# Patient Record
Sex: Male | Born: 1990 | Race: White | Hispanic: No | Marital: Single | Smoking: Former smoker
Health system: Southern US, Community
[De-identification: ages and names within clinical notes are randomized; demographics above are authoritative.]

## PROBLEM LIST (undated history)

## (undated) DIAGNOSIS — F329 Major depressive disorder, single episode, unspecified: Secondary | ICD-10-CM

## (undated) DIAGNOSIS — F32A Depression, unspecified: Secondary | ICD-10-CM

## (undated) DIAGNOSIS — F419 Anxiety disorder, unspecified: Secondary | ICD-10-CM

## (undated) DIAGNOSIS — J45909 Unspecified asthma, uncomplicated: Secondary | ICD-10-CM

## (undated) HISTORY — PX: WISDOM TOOTH EXTRACTION: SHX21

## (undated) HISTORY — DX: Anxiety disorder, unspecified: F41.9

## (undated) HISTORY — DX: Unspecified asthma, uncomplicated: J45.909

---

## 2008-01-09 ENCOUNTER — Emergency Department (HOSPITAL_COMMUNITY): Admission: EM | Admit: 2008-01-09 | Discharge: 2008-01-09 | Payer: Self-pay | Admitting: Emergency Medicine

## 2008-05-17 ENCOUNTER — Emergency Department: Payer: Self-pay | Admitting: Emergency Medicine

## 2008-10-22 IMAGING — CT CT HEAD W/O CM
1 series · 16 of 30 positions shown, 20 images · IV contrast (agent unspecified)
Comparison: none

CLINICAL DATA: Dizziness, nausea, difficulty walking.  
 HEAD CT WITHOUT CONTRAST:
TECHNIQUE: Contiguous axial images were obtained from the base of the skull through the vertex according to standard protocol without contrast.

[Series 2: head_seq 4.5 h37s st · axial · 0.43mm/px · z∈[+1185,+1329]mm · 16 of 36 slices shown, 20 images]
[im 2/36  brain]
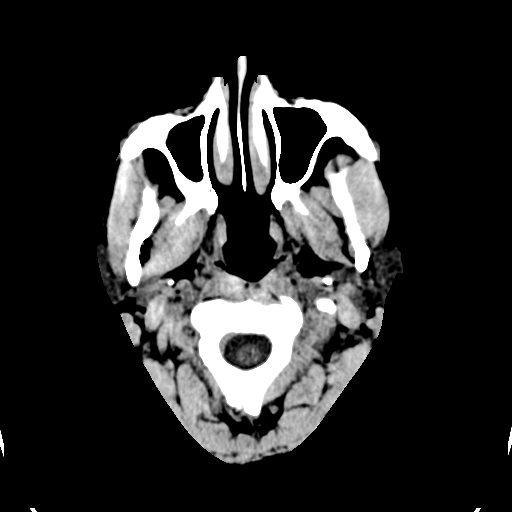
[im 2/36  bone]
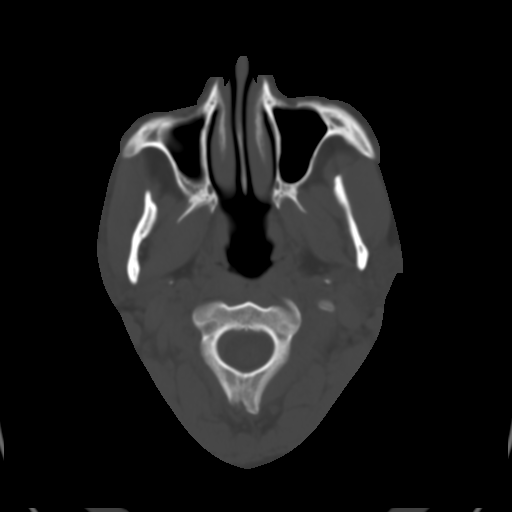
[im 4/36  brain]
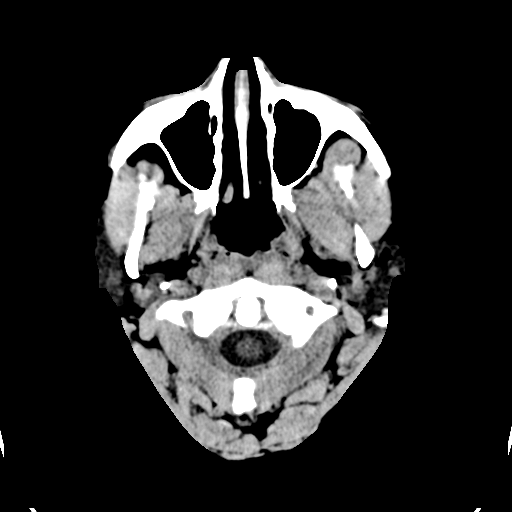
[im 7/36  brain]
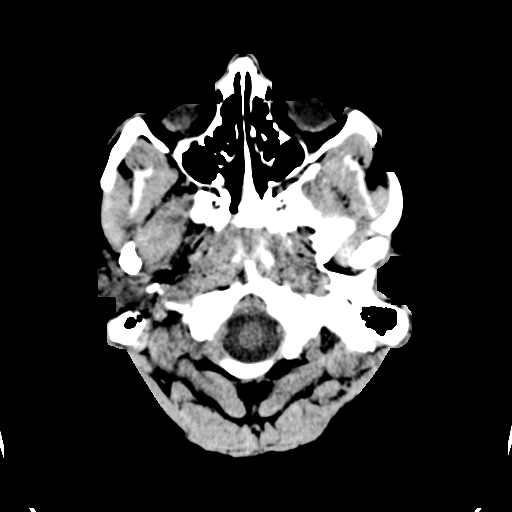
[im 9/36  brain]
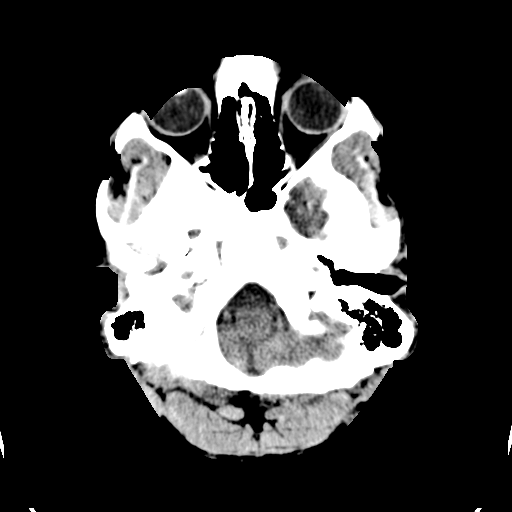
[im 10/36  brain]
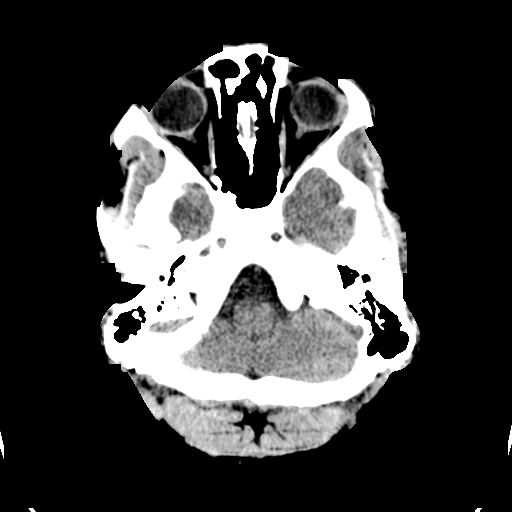
[im 10/36  bone]
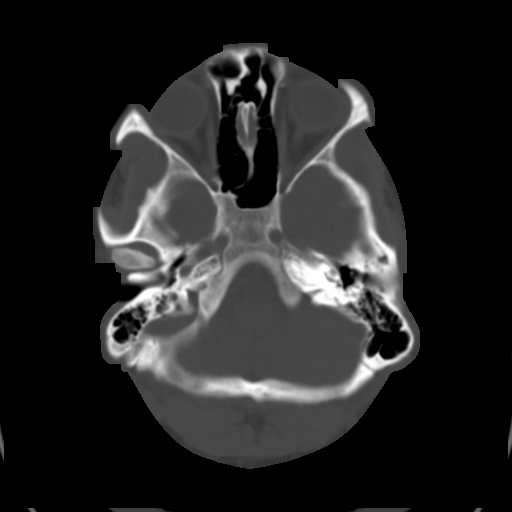
[im 13/36  brain]
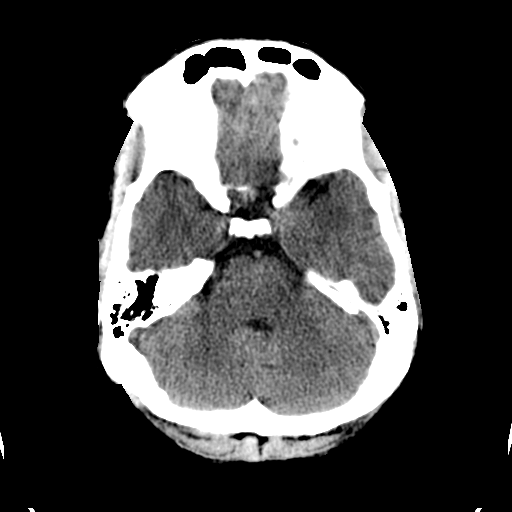
[im 15/36  brain]
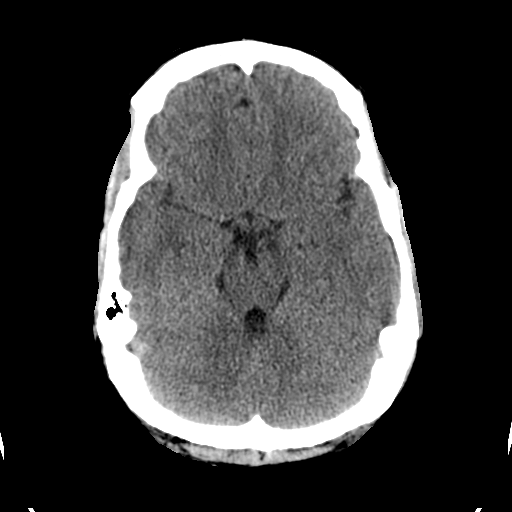
[im 17/36  brain]
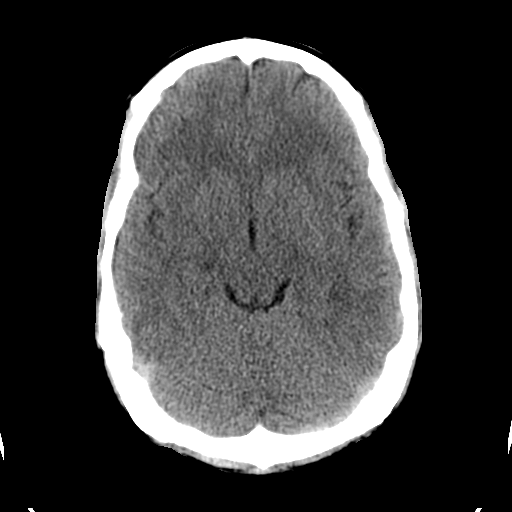
[im 19/36  brain]
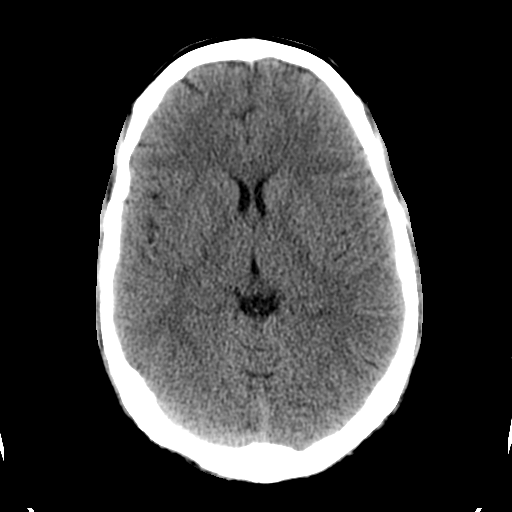
[im 19/36  bone]
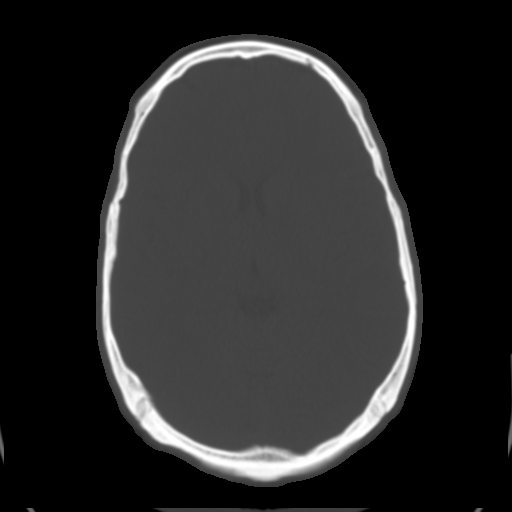
[im 21/36  brain]
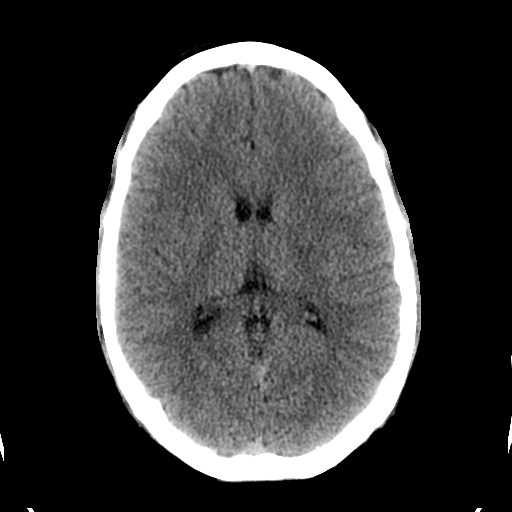
[im 23/36  brain]
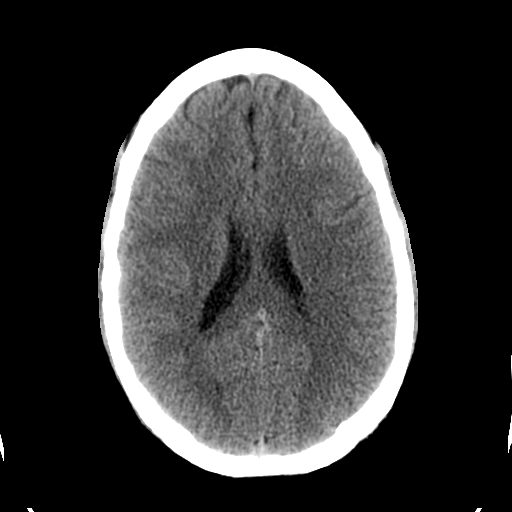
[im 26/36  brain]
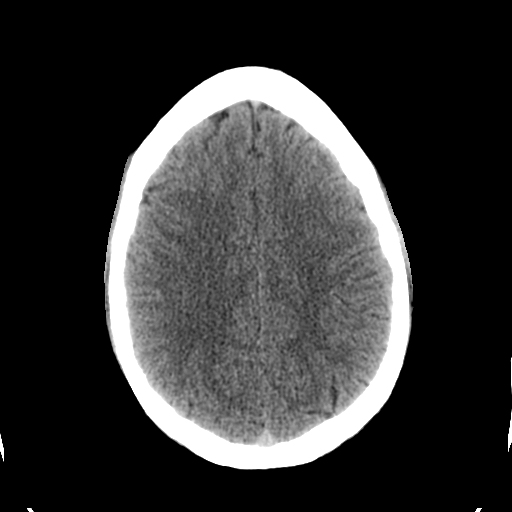
[im 27/36  brain]
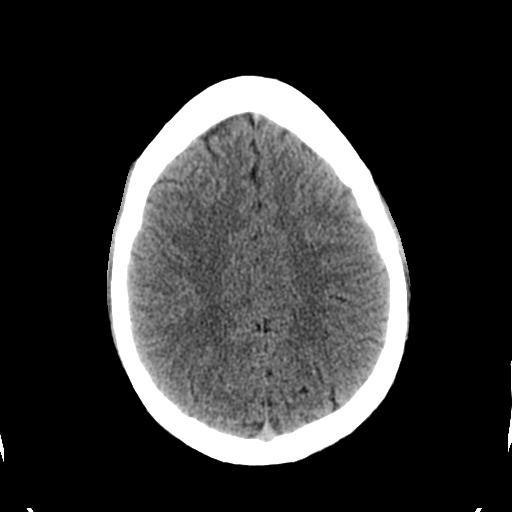
[im 27/36  bone]
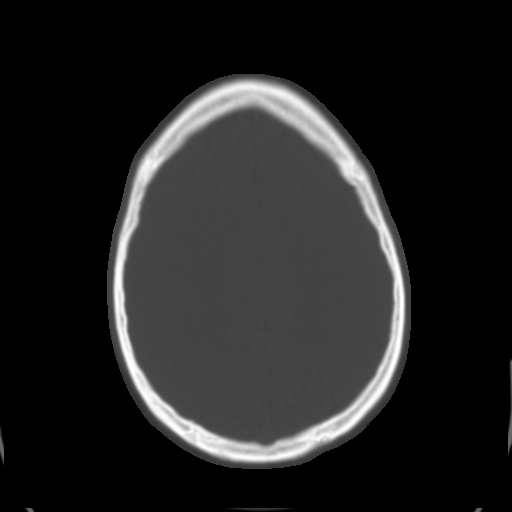
[im 29/36  brain]
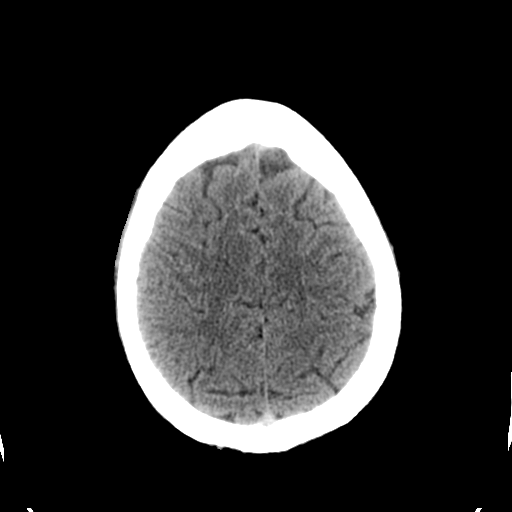
[im 32/36  brain]
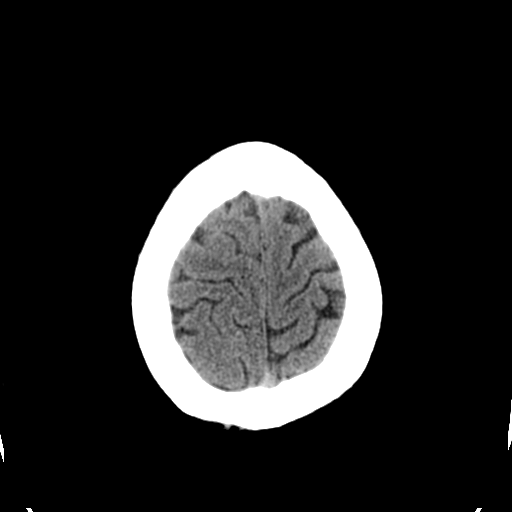
[im 34/36  brain]
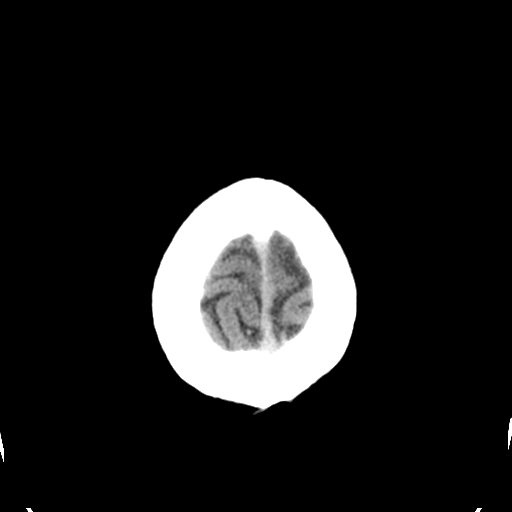

[16 of 30 positions shown; findings below may reference images not displayed]

FINDINGS: The brain appears normal without evidence of hemorrhage, infarct, mass, mass effect, midline shift or abnormal extra-axial fluid collection.  No hydrocephalus.  A small mucous retention cyst vs polyp is identified in the floor of the right maxillary sinus.
IMPRESSION: No acute intracranial abnormality.

## 2018-08-27 ENCOUNTER — Ambulatory Visit (HOSPITAL_COMMUNITY)
Admission: RE | Admit: 2018-08-27 | Discharge: 2018-08-27 | Disposition: A | Discharge status: Home / Self Care | Payer: No Typology Code available for payment source | Attending: Psychiatry | Admitting: Psychiatry

## 2018-08-27 DIAGNOSIS — Z1330 Encounter for screening examination for mental health and behavioral disorders, unspecified: Secondary | ICD-10-CM | POA: Diagnosis not present

## 2018-08-27 NOTE — H&P (Signed)
Behavioral Health Medical Screening Exam  Stephen CrockerStephen A Vanderbeck is an 27 y.o. male who presents as a walk in reporting depressive symptoms. Denies SI/HI. Patient states "I just need a direction to go in. I think either counseling or medications no sure." Patient was provided with resources for [facility] outpatient services. Patient did not meet the criteria for inpatient psychiatric admission.   Total Time spent with patient: 20 minutes  Psychiatric Specialty Exam: Physical Exam  Constitutional: He is oriented to person, place, and time. He appears well-developed and well-nourished.  HENT:  Head: Normocephalic.  Cardiovascular: Normal rate, regular rhythm, normal heart sounds and intact distal pulses.  Respiratory: Effort normal and breath sounds normal.  GI: Soft. Bowel sounds are normal.  Musculoskeletal: Normal range of motion.  Neurological: He is alert and oriented to person, place, and time.  Skin: Skin is warm and dry.    ROS  Blood pressure 130/74, pulse (!) 50, temperature 98 F (36.7 C), resp. rate 16, SpO2 100 %.There is no height or weight on file to calculate BMI.  General Appearance: Casual  Eye Contact:  Good  Speech:  Clear and Coherent  Volume:  Normal  Mood:  Depressed  Affect:  Flat  Thought Process:  Coherent and Goal Directed  Orientation:  Full (Time, Place, and Person)  Thought Content:  Depressive symptoms  Suicidal Thoughts:  No  Homicidal Thoughts:  No  Memory:  Immediate;   Good Recent;   Good Remote;   Good  Judgement:  Good  Insight:  Good  Psychomotor Activity:  Normal  Concentration: Concentration: Good and Attention Span: Good  Recall:  Good  Fund of Knowledge:Good  Language: Good  Akathisia:  No  Handed:  Right  AIMS (if indicated):     Assets:  Communication Skills Desire for Improvement Financial Resources/Insurance Housing Intimacy Leisure Time Physical Health Resilience Social Support Talents/Skills  Sleep:        Musculoskeletal: Strength & Muscle Tone: within normal limits Gait & Station: normal Patient leans: N/A  Blood pressure 130/74, pulse (!) 50, temperature 98 F (36.7 C), resp. rate 16, SpO2 100 %.  Recommendations:  Based on my evaluation the patient does not appear to have an emergency medical condition.  Fransisca KaufmannAVIS, Riata Ikeda, NP 08/27/2018, 3:31 PM

## 2018-08-27 NOTE — BH Assessment (Signed)
Assessment Note  Stephen Grant is an 27 y.o. male presenting voluntarily to [facility] for assessment. Patient presented tearful during assessment. He stated "I feel hopeless and it get progressively worse. I have no dreams and I feel like I am going nowhere." Patient stated he has always experienced depression and anxiety and sometimes it is worse than others. Patient saw a therapist and psychiatrist in 2017. He stated it was helpful but is currently not receiving outpatient services. Patient reported experiences hopelessness, irritability, loss of pleasure, and low motivation. He denied suicidal ideation. Patient does not have a history of SI, self harm, or hospitalizations. Patient was tearful talking about the death of his mother to cancer in 2015. Patient denied HI/AVH. Patient does not use alcohol but reports marijuana use once a week. Patient reports difficulty staying asleep. He denied in any history of abuse. Patient reviewed his father is diagnosed with schizophrenia and his mother had depression. Patient denied any criminal charges.  Patient was alert and oriented x 4. His speech was a normal rate and rhythm. His mood was anxious and depressed, affect was congruent. Patient's memory is intact and he has fair insight. He denied SI/HI/AVH. Patient did not appear to be responding to internal stimuli. Per Fransisca Kaufmann, NP patient does not meet in patient criteria. Discharged with outpatient resources.  Diagnosis: F33.2 MDD recurrent, severe  Past Medical History: No past medical history on file.  Family History: No family history on file.  Social History:  has no tobacco, alcohol, and drug history on file.  Additional Social History:  Alcohol / Drug Use Pain Medications: see MAR Prescriptions: see MAR Over the Counter: see MAR History of alcohol / drug use?: Yes Longest period of sobriety (when/how long): 3 months Substance #1 Name of Substance 1: Marijuana 1 - Age of First Use: "teenager",  started agian 3 months ago 1 - Amount (size/oz): unknown 1 - Frequency: unknown 1 - Duration: unknown 1 - Last Use / Amount: 3 days ago  CIWA: CIWA-Ar BP: 130/74 Pulse Rate: (!) 50 COWS:    Allergies: Allergies not on file  Home Medications:  (Not in a hospital admission)  OB/GYN Status:  No LMP for male patient.  General Assessment Data Location of Assessment: [facility] Assessment Services TTS Assessment: In system Is this a Tele or Face-to-Face Assessment?: Face-to-Face Is this an Initial Assessment or a Re-assessment for this encounter?: Initial Assessment Patient Accompanied by:: Other(self) Language Other than English: No Living Arrangements: (rents room in a house) What gender do you identify as?: Male Marital status: Single Maiden name: n/a Pregnancy Status: No Living Arrangements: Non-relatives/Friends Can pt return to current living arrangement?: Yes Admission Status: Voluntary Is patient capable of signing voluntary admission?: Yes Referral Source: Self/Family/Friend Insurance type: Medcost     Crisis Care Plan Living Arrangements: Non-relatives/Friends     Risk to self with the past 6 months Suicidal Ideation: No Has patient been a risk to self within the past 6 months prior to admission? : No Suicidal Intent: No Has patient had any suicidal intent within the past 6 months prior to admission? : No Is patient at risk for suicide?: No Suicidal Plan?: No Has patient had any suicidal plan within the past 6 months prior to admission? : No Access to Means: No What has been your use of drugs/alcohol within the last 12 months?: uses marijuana Previous Attempts/Gestures: No How many times?: 0 Other Self Harm Risks: none Triggers for Past Attempts: (n/a) Intentional Self Injurious Behavior:  None Family Suicide History: No Recent stressful life event(s): Loss (Comment)(mother) Persecutory voices/beliefs?: No Depression: Yes Depression Symptoms: Despondent,  Insomnia, Tearfulness, Isolating, Fatigue, Guilt, Loss of interest in usual pleasures, Feeling worthless/self pity, Feeling angry/irritable Substance abuse history and/or treatment for substance abuse?: No Suicide prevention information given to non-admitted patients: Not applicable  Risk to Others within the past 6 months Homicidal Ideation: No Does patient have any lifetime risk of violence toward others beyond the six months prior to admission? : No Thoughts of Harm to Others: No Current Homicidal Intent: No Current Homicidal Plan: No Access to Homicidal Means: No Identified Victim: n/a History of harm to others?: No Assessment of Violence: None Noted Violent Behavior Description: n/a Does patient have access to weapons?: No Criminal Charges Pending?: No Does patient have a court date: No Is patient on probation?: No  Psychosis Hallucinations: None noted Delusions: None noted  Mental Status Report Appearance/Hygiene: Unremarkable Eye Contact: Fair Motor Activity: Freedom of movement Speech: Slow Level of Consciousness: Alert, Crying Mood: Depressed, Anxious, Worthless, low self-esteem Affect: Anxious, Sad Anxiety Level: Moderate Thought Processes: Coherent, Relevant Judgement: Unimpaired Orientation: Person, Place, Time, Situation Obsessive Compulsive Thoughts/Behaviors: None  Cognitive Functioning Concentration: Normal Memory: Recent Intact, Remote Intact Is patient IDD: No Insight: Good Impulse Control: Good Appetite: Good Have you had any weight changes? : No Change Sleep: Decreased Total Hours of Sleep: 4 Vegetative Symptoms: Staying in bed  ADLScreening [facility] Assessment Services) Patient's cognitive ability adequate to safely complete daily activities?: Yes Patient able to express need for assistance with ADLs?: Yes Independently performs ADLs?: Yes (appropriate for developmental age)  Prior Inpatient Therapy Prior Inpatient Therapy: No  Prior  Outpatient Therapy Prior Outpatient Therapy: [facility]) Prior Therapy Dates: 2017 Prior Therapy Facilty/Provider(s): [facility] Reason for Treatment: depression Does patient have an ACCT team?: No Does patient have Intensive In-House Services?  : No Does patient have [facility] services? : No Does patient have P4CC services?: No  ADL Screening (condition at time of admission) Patient's cognitive ability adequate to safely complete daily activities?: Yes Is the patient deaf or have difficulty hearing?: No Does the patient have difficulty seeing, even when wearing glasses/contacts?: No Does the patient have difficulty concentrating, remembering, or making decisions?: No Patient able to express need for assistance with ADLs?: Yes Does the patient have difficulty dressing or bathing?: No Independently performs ADLs?: Yes (appropriate for developmental age) Does the patient have difficulty walking or climbing stairs?: No Weakness of Legs: None Weakness of Arms/Hands: None     Therapy Consults (therapy consults require a physician order) PT Evaluation Needed: No OT Evalulation Needed: No SLP Evaluation Needed: No Abuse/Neglect Assessment (Assessment to be complete while patient is alone) Abuse/Neglect Assessment Can Be Completed: Yes Physical Abuse: Denies Verbal Abuse: Denies Sexual Abuse: Denies Exploitation of patient/patient's resources: Denies Self-Neglect: Denies Values / Beliefs Cultural Requests During Hospitalization: None Spiritual Requests During Hospitalization: None Consults Spiritual Care Consult Needed: No Social Work Consult Needed: No Merchant navy officer (For Healthcare) Does Patient Have a Medical Advance Directive?: No Would patient like information on creating a medical advance directive?: No - Patient declined          Disposition: Per Fransisca Kaufmann, NP patient does not meet in patient criteria. Discharged with outpatient resources. Disposition Initial  Assessment Completed for this Encounter: Yes Disposition of Patient: Discharge Patient refused recommended treatment: No Mode of transportation if patient is discharged?: Car Patient referred to: Outpatient clinic referral  On Site Evaluation by:   Reviewed  with Physician:    Celedonio MiyamotoMeredith  Jackelynn Hosie 08/27/2018 3:48 PM

## 2018-09-21 ENCOUNTER — Encounter (HOSPITAL_COMMUNITY): Payer: Self-pay | Admitting: *Deleted

## 2018-09-21 ENCOUNTER — Other Ambulatory Visit: Payer: Self-pay

## 2018-09-21 ENCOUNTER — Emergency Department (HOSPITAL_COMMUNITY)
Admission: EM | Admit: 2018-09-21 | Discharge: 2018-09-21 | Discharge status: Against Medical Advice | Payer: Self-pay | Attending: Emergency Medicine | Admitting: Emergency Medicine

## 2018-09-21 DIAGNOSIS — Y69 Unspecified misadventure during surgical and medical care: Secondary | ICD-10-CM | POA: Insufficient documentation

## 2018-09-21 DIAGNOSIS — Z5321 Procedure and treatment not carried out due to patient leaving prior to being seen by health care provider: Secondary | ICD-10-CM | POA: Insufficient documentation

## 2018-09-21 DIAGNOSIS — T887XXA Unspecified adverse effect of drug or medicament, initial encounter: Secondary | ICD-10-CM | POA: Insufficient documentation

## 2018-09-21 HISTORY — DX: Depression, unspecified: F32.A

## 2018-09-21 HISTORY — DX: Major depressive disorder, single episode, unspecified: F32.9

## 2018-09-21 NOTE — ED Notes (Signed)
Per registration, pt left.  

## 2018-09-21 NOTE — ED Triage Notes (Signed)
Pt says that he took Nyquil tonight and was reading the label and saw where it may interact with his fluoxetine. Last took fluoxetine in the morning and took nyquil tonight. No symptoms, just worried about interaction.

## 2018-10-22 ENCOUNTER — Ambulatory Visit (HOSPITAL_COMMUNITY): Payer: Self-pay | Admitting: Licensed Clinical Social Worker

## 2018-11-05 ENCOUNTER — Ambulatory Visit (HOSPITAL_COMMUNITY): Payer: No Typology Code available for payment source | Admitting: Psychiatry

## 2019-10-14 ENCOUNTER — Other Ambulatory Visit: Payer: Self-pay

## 2019-10-14 DIAGNOSIS — Z20822 Contact with and (suspected) exposure to covid-19: Secondary | ICD-10-CM

## 2019-10-15 LAB — NOVEL CORONAVIRUS, NAA: SARS-CoV-2, NAA: NOT DETECTED

## 2020-07-11 ENCOUNTER — Ambulatory Visit (HOSPITAL_COMMUNITY)
Admission: EM | Admit: 2020-07-11 | Discharge: 2020-07-11 | Disposition: A | Discharge status: Home / Self Care | Payer: No Typology Code available for payment source | Attending: Family Medicine | Admitting: Family Medicine

## 2020-07-11 ENCOUNTER — Encounter (HOSPITAL_COMMUNITY): Payer: Self-pay

## 2020-07-11 ENCOUNTER — Other Ambulatory Visit: Payer: Self-pay

## 2020-07-11 DIAGNOSIS — M545 Low back pain, unspecified: Secondary | ICD-10-CM

## 2020-07-11 MED ORDER — METHYLPREDNISOLONE ACETATE 40 MG/ML IJ SUSP
40.0000 mg | Freq: Once | INTRAMUSCULAR | Status: AC
  Administered 2020-07-11: 40 mg via INTRAMUSCULAR

## 2020-07-11 MED ORDER — METHYLPREDNISOLONE ACETATE 40 MG/ML IJ SUSP
INTRAMUSCULAR | Status: AC
  Filled 2020-07-11: qty 1

## 2020-07-11 MED ORDER — IBUPROFEN 600 MG PO TABS: 600.0000 mg | ORAL_TABLET | Freq: Three times a day (TID) | ORAL | 0 refills | Status: DC | PRN

## 2020-07-11 MED ORDER — CYCLOBENZAPRINE HCL 10 MG PO TABS: 10.0000 mg | ORAL_TABLET | Freq: Three times a day (TID) | ORAL | 0 refills | Status: DC | PRN

## 2020-07-11 MED ORDER — KETOROLAC TROMETHAMINE 30 MG/ML IJ SOLN
INTRAMUSCULAR | Status: AC
  Filled 2020-07-11: qty 1

## 2020-07-11 MED ORDER — KETOROLAC TROMETHAMINE 30 MG/ML IJ SOLN
30.0000 mg | Freq: Once | INTRAMUSCULAR | Status: AC
  Administered 2020-07-11: 30 mg via INTRAMUSCULAR

## 2020-07-11 NOTE — Discharge Instructions (Signed)
Please try heat  Please don't take ibuprofen until after midnight  Please try the exercises  Please follow up if your symptoms fail to improve.

## 2020-07-11 NOTE — ED Triage Notes (Signed)
Pt presents to UC for bilateral lower back pain radiating to bilateral hips. Pt states pain began this morning with getting dressed. Pt states he has had episode like this in the past where his back "locked up but not this bad". Pt has been treating motrin with minimal relief. Last dose 400 mg at 1600. Pt has severely limited range of motion. Pt two person transfer from waiting room chair to wheelchair. Pt normally independently ambulatory. Pt states decrease in ROM is r/t pain and "tightness".

## 2020-07-11 NOTE — ED Provider Notes (Signed)
[facility]    CSN: [id number] Arrival date & time: 07/11/20  1708      History   Chief Complaint Chief Complaint  Patient presents with  . Back Pain    HPI Stephen Grant is a 29 y.o. male.   He is presenting with acute low back pain.  This started when he woke up this morning.  Denies a history of nephrolithiasis.  No fevers or back surgeries.  Has taken ibuprofen for the pain which did improve his symptoms.  No new or different exercises.  Has trouble with ambulation.  HPI  Past Medical History:  Diagnosis Date  . Depression     There are no problems to display for this patient.   History reviewed. No pertinent surgical history.     Home Medications    Prior to Admission medications   Medication Sig Start Date End Date Taking? Authorizing Provider  cyclobenzaprine (FLEXERIL) 10 MG tablet Take 1 tablet (10 mg total) by mouth 3 (three) times daily as needed. 07/11/20   Myra Rude, MD  ibuprofen (ADVIL) 600 MG tablet Take 1 tablet (600 mg total) by mouth every 8 (eight) hours as needed. 07/11/20   Myra Rude, MD    Family History History reviewed. No pertinent family history.  Social History Social History   Tobacco Use  . Smoking status: Former Smoker  Substance Use Topics  . Alcohol use: Yes  . Drug use: Never     Allergies   Patient has no known allergies.   Review of Systems Review of Systems  See HPI   Physical Exam Triage Vital Signs ED Triage Vitals [07/11/20 1828]  Enc Vitals Group     BP      Pulse      Resp      Temp      Temp src      SpO2      Weight      Height      Head Circumference      Peak Flow      Pain Score 0     Pain Loc      Pain Edu?      Excl. in GC?    No data found.  Updated Vital Signs There were no vitals taken for this visit.  Visual Acuity Right Eye Distance:   Left Eye Distance:   Bilateral Distance:    Right Eye Near:   Left Eye Near:    Bilateral Near:     Physical  Exam Gen: NAD, alert, cooperative with exam, well-appearing ENT: normal lips, normal nasal mucosa,  Eye: normal EOM, normal conjunctiva and lids Skin: no rashes, no areas of induration  Neuro: normal tone, normal sensation to touch Psych:  normal insight, alert and oriented MSK:  Back: Able to extend his legs. Negative straight leg. Normal plantar flexion dorsiflexion. Neurovascular intact   UC Treatments / Results  Labs (all labs ordered are listed, but only abnormal results are displayed) Labs Reviewed - No data to display  EKG   Radiology No results found.  Procedures Procedures (including critical care time)  Medications Ordered in UC Medications  ketorolac (TORADOL) 30 MG/ML injection 30 mg (30 mg Intramuscular Given 07/11/20 1859)  methylPREDNISolone acetate (DEPO-MEDROL) injection 40 mg (40 mg Intramuscular Given 07/11/20 1859)    Initial Impression / Assessment and Plan / UC Course  I have reviewed the triage vital signs and the nursing notes.  Pertinent labs &  imaging results that were available during my care of the patient were reviewed by me and considered in my medical decision making (see chart for details).     Stephen Grant is a 29 year old male that is presenting with acute low back pain.  Likely related to muscle spasm.  Less likely for infection or nerve entrapment.  Provided Toradol and Depo-Medrol injection.  Counseled on home exercise therapy and supportive care.  Provide ibuprofen and Flexeril.  Given indications to follow-up.  Final Clinical Impressions(s) / UC Diagnoses   Final diagnoses:  Acute bilateral low back pain without sciatica     Discharge Instructions     Please try heat  Please don't take ibuprofen until after midnight  Please try the exercises  Please follow up if your symptoms fail to improve.     ED Prescriptions    Medication Sig Dispense Auth. Provider   ibuprofen (ADVIL) 600 MG tablet Take 1 tablet (600 mg total) by  mouth every 8 (eight) hours as needed. 40 tablet Myra Rude, MD   cyclobenzaprine (FLEXERIL) 10 MG tablet Take 1 tablet (10 mg total) by mouth 3 (three) times daily as needed. 30 tablet Myra Rude, MD     PDMP not reviewed this encounter.   Myra Rude, MD 07/11/20 [phone]

## 2022-12-16 ENCOUNTER — Ambulatory Visit (INDEPENDENT_AMBULATORY_CARE_PROVIDER_SITE_OTHER): Payer: Commercial Managed Care - PPO | Admitting: Family

## 2022-12-16 ENCOUNTER — Encounter: Payer: Self-pay | Admitting: Family

## 2022-12-16 VITALS — BP 137/86 | HR 66 | Temp 97.8°F | Ht 69.0 in | Wt 158.5 lb

## 2022-12-16 DIAGNOSIS — J3089 Other allergic rhinitis: Secondary | ICD-10-CM | POA: Diagnosis not present

## 2022-12-16 MED ORDER — TRIAMCINOLONE ACETONIDE 55 MCG/ACT NA AERO: 1.0000 | INHALATION_SPRAY | Freq: Every day | NASAL | 1 refills | Status: AC

## 2022-12-16 NOTE — Progress Notes (Signed)
Patient ID: Stephen Grant, male    DOB: June 21, 1991, 32 y.o.   MRN: [id number]  Chief Complaint  Patient presents with  . New Patient (Initial Visit)  . Sinus Problem    Pt c/o Nasal congestion and thin mucus for the past 3 months off and on. Has tried dayquil which did not help and benadryl decongestant which open sinuses.    HPI:      Sinus sx:  nasal drainage that fluctuates between thin & clear to thicker and light yellow in color. Has nasal congestion, fever only when it first started a few months ago.        Assessment & Plan:  1. Non-seasonal allergic rhinitis due to other allergic trigger advised to get saline nasal spray and use tid and prior to medicated sprays. Sending Nasacort, advised on use & SE, use humidifier overnight, drink plenty of fluids.  - triamcinolone (NASACORT) 55 MCG/ACT AERO nasal inhaler; Place 1 spray into the nose daily. START with 1 spray each side bid for 3 days, then reduce to qd  Dispense: 16.9 mL; Refill: 1  Subjective:    Outpatient Medications Prior to Visit  Medication Sig Dispense Refill  . cyclobenzaprine (FLEXERIL) 10 MG tablet Take 1 tablet (10 mg total) by mouth 3 (three) times daily as needed. 30 tablet 0  . ibuprofen (ADVIL) 600 MG tablet Take 1 tablet (600 mg total) by mouth every 8 (eight) hours as needed. 40 tablet 0   No facility-administered medications prior to visit.   Past Medical History:  Diagnosis Date  . Anxiety   . Asthma   . Depression    Past Surgical History:  Procedure Laterality Date  . WISDOM TOOTH EXTRACTION     No Known Allergies    Objective:    Physical Exam Vitals and nursing note reviewed.  Constitutional:      General: He is not in acute distress.    Appearance: Normal appearance.  HENT:     Head: Normocephalic.     Right Ear: Ear canal normal. No tenderness. Tympanic membrane is injected. Tympanic membrane is not erythematous or bulging.     Left Ear: Tympanic membrane and ear canal normal.      Mouth/Throat:     Mouth: Mucous membranes are moist.     Pharynx: Posterior oropharyngeal erythema present. No pharyngeal swelling, oropharyngeal exudate or uvula swelling.     Tonsils: No tonsillar exudate or tonsillar abscesses.  Cardiovascular:     Rate and Rhythm: Normal rate and regular rhythm.  Pulmonary:     Effort: Pulmonary effort is normal.     Breath sounds: Normal breath sounds.  Musculoskeletal:        General: Normal range of motion.     Cervical back: Normal range of motion.  Lymphadenopathy:     Head:     Right side of head: No preauricular or posterior auricular adenopathy.     Left side of head: No preauricular or posterior auricular adenopathy.     Cervical: No cervical adenopathy.     Upper Body:     Right upper body: No supraclavicular adenopathy.     Left upper body: No supraclavicular adenopathy.  Skin:    General: Skin is warm and dry.  Neurological:     Mental Status: He is alert and oriented to person, place, and time.  Psychiatric:        Mood and Affect: Mood normal.   BP 137/86 (BP Location: Left Arm, Patient  Position: Sitting, Cuff Size: Large)   Pulse 66   Temp 97.8 F (36.6 C) (Temporal)   Ht 5\' 9"  (1.753 m)   Wt 158 lb 8 oz (71.9 kg)   SpO2 99%   BMI 23.41 kg/m  Wt Readings from Last 3 Encounters:  12/16/22 158 lb 8 oz (71.9 kg)  09/21/18 150 lb (68 kg)       Jeanie Sewer, NP

## 2022-12-18 DIAGNOSIS — J309 Allergic rhinitis, unspecified: Secondary | ICD-10-CM | POA: Insufficient documentation

## 2022-12-18 NOTE — Patient Instructions (Addendum)
Welcome to [facility], It was a pleasure meeting you today!    As discussed, I have sent the generic Nasacort to your pharmacy. If this is not helping your symptoms, review the below protocol and if this is still not helping, lease schedule a follow up visit so we can discuss further.  Follow the instructions below: Use Nasal saline spray (i.e., Simply Saline or other generic) or a nasal saline lavage (i.e., NeilMed, Neti Pot) to flush allergens, general disinfecting &  prior to using medicated nasal sprays. Start Nasacort nasal spray 1 spray each nostril twice a day for 3 days, then reduce to daily use. 2 sprays twice a day is ok for really bad symptoms for 1 week, then reduce to 1 spray twice a day or daily. May add over the counter antihistamines such as Xyzal (levocetirizine), Zyrtec (cetirizine), Claritin (loratadine), or Allegra (fexofenadine) daily as needed. May take twice a day if needed as long as it does not cause drowsiness or too much dryness in nose, mouth or throat. May also use Pataday or generic over the counter eye drops: 1 drop in each eye daily as needed for itchy/watery eyes.     PLEASE NOTE: If you had any LAB tests please let us know if you have not heard back within a few days. You may see your results on MyChart before we have a chance to review them but we will give you a call once they are reviewed by [place]. If we ordered any REFERRALS today, please let us know if you have not heard from their office within the next week.  Let us know through MyChart if you are needing REFILLS, or have your pharmacy send [place] the request. You can also use MyChart to communicate with me or any office staff.  Please try these tips to maintain a healthy lifestyle: It is important that you exercise regularly at least 30 minutes 5 times a week. Think about what you will eat, plan ahead. Choose whole foods, & think  "clean, green, fresh or frozen" over canned,  processed or packaged foods which are more sugary, salty, and fatty. 70 to 75% of food eaten should be fresh vegetables and protein. 2-3  meals daily with healthy snacks between meals, but must be whole fruit, protein or vegetables. Aim to eat over a 10 hour period when you are active, for example, 7am to 5pm, and then STOP after your last meal of the day, drinking only water.  Shorter eating windows, 6-8 hours, are showing benefits in heart disease and blood sugar regulation. Drink water every day! Shoot for 64 ounces daily = 8 cups, no other drink is as healthy! Fruit juice is best enjoyed in a healthy way, by EATING the fruit.

## 2023-01-23 ENCOUNTER — Telehealth: Payer: Self-pay | Admitting: Family

## 2023-01-23 NOTE — Telephone Encounter (Signed)
yes, ok with me.

## 2023-01-23 NOTE — Telephone Encounter (Signed)
Patient is requesting to transfer care from [facility] to Dr. Randol Kern. States he now prefers a male provider. Is this okay?

## 2023-02-06 ENCOUNTER — Ambulatory Visit: Payer: Commercial Managed Care - PPO | Admitting: Internal Medicine

## 2023-05-24 ENCOUNTER — Ambulatory Visit: Payer: Commercial Managed Care - PPO | Admitting: Family

## 2023-05-26 ENCOUNTER — Ambulatory Visit (INDEPENDENT_AMBULATORY_CARE_PROVIDER_SITE_OTHER): Payer: Commercial Managed Care - PPO | Admitting: Family

## 2023-05-26 VITALS — BP 118/75 | HR 64 | Temp 98.0°F | Ht 69.0 in | Wt 161.8 lb

## 2023-05-26 DIAGNOSIS — J392 Other diseases of pharynx: Secondary | ICD-10-CM | POA: Diagnosis not present

## 2023-05-26 NOTE — Progress Notes (Signed)
Patient ID: Stephen Grant, male    DOB: 1991/05/29, 32 y.o.   MRN: [id number]  Chief Complaint  Patient presents with   Sore Throat    sx for 3w    HPI:      Sore throat: Pt c/o sore throat with hoarseness with mucous (clear), Off and on but lasts for 3 weeks. Pt states it is hard to talk by the end of the day and feels swollen in neck.  Pain behind both ears. Overall he is feeling a little better than when he first made appointment.    Assessment & Plan:  1. Throat irritation - no erythema or exudate, tonsils wnl. Mucus noted. Advised he restart his Nasacort nasal spray bid x 3d and then every day until sx are resolved. Can also use saline nasal spray tid prn. Drink plenty of fluids.   Subjective:    Outpatient Medications Prior to Visit  Medication Sig Dispense Refill   triamcinolone (NASACORT) 55 MCG/ACT AERO nasal inhaler Place 1 spray into the nose daily. START with 1 spray each side bid for 3 days, then reduce to qd 16.9 mL 1   No facility-administered medications prior to visit.   Past Medical History:  Diagnosis Date   Anxiety    Asthma    Depression    Past Surgical History:  Procedure Laterality Date   WISDOM TOOTH EXTRACTION     No Known Allergies    Objective:    Physical Exam Vitals and nursing note reviewed.  Constitutional:      General: He is not in acute distress.    Appearance: Normal appearance.  HENT:     Head: Normocephalic.     Right Ear: Tympanic membrane and ear canal normal.     Left Ear: Tympanic membrane and ear canal normal.     Mouth/Throat:     Mouth: Mucous membranes are moist.     Pharynx: Oropharyngeal exudate present. No pharyngeal swelling, posterior oropharyngeal erythema or uvula swelling.     Tonsils: No tonsillar exudate or tonsillar abscesses. 1+ on the right. 1+ on the left.  Cardiovascular:     Rate and Rhythm: Normal rate and regular rhythm.  Pulmonary:     Effort: Pulmonary effort is normal.     Breath sounds:  Normal breath sounds.  Musculoskeletal:        General: Normal range of motion.     Cervical back: Normal range of motion.  Lymphadenopathy:     Head:     Right side of head: No tonsillar, preauricular or posterior auricular adenopathy.     Left side of head: No tonsillar, preauricular or posterior auricular adenopathy.     Cervical: Cervical adenopathy present.     Right cervical: Superficial cervical adenopathy (mild, non-tender) present.     Left cervical: Superficial cervical adenopathy (mild, non-tender) present.  Skin:    General: Skin is warm and dry.  Neurological:     Mental Status: He is alert and oriented to person, place, and time.  Psychiatric:        Mood and Affect: Mood normal.    BP 118/75 (BP Location: Left Arm, Patient Position: Sitting, Cuff Size: Normal)   Pulse 64   Temp 98 F (36.7 C) (Temporal)   Ht 5\' 9"  (1.753 m)   Wt 161 lb 12.8 oz (73.4 kg)   SpO2 98%   BMI 23.89 kg/m  Wt Readings from Last 3 Encounters:  05/26/23 161 lb 12.8 oz (73.4 kg)  12/16/22 158 lb 8 oz (71.9 kg)  09/21/18 150 lb (68 kg)       Dulce Sellar, NP

## 2023-06-16 ENCOUNTER — Ambulatory Visit (INDEPENDENT_AMBULATORY_CARE_PROVIDER_SITE_OTHER): Payer: Commercial Managed Care - PPO | Admitting: Family

## 2023-06-16 ENCOUNTER — Encounter: Payer: Self-pay | Admitting: Family

## 2023-06-16 VITALS — BP 112/74 | HR 76 | Temp 97.1°F | Ht 69.0 in | Wt 159.2 lb

## 2023-06-16 DIAGNOSIS — Z789 Other specified health status: Secondary | ICD-10-CM

## 2023-06-16 DIAGNOSIS — Z1159 Encounter for screening for other viral diseases: Secondary | ICD-10-CM | POA: Diagnosis not present

## 2023-06-16 DIAGNOSIS — Z114 Encounter for screening for human immunodeficiency virus [HIV]: Secondary | ICD-10-CM | POA: Diagnosis not present

## 2023-06-16 DIAGNOSIS — Z0000 Encounter for general adult medical examination without abnormal findings: Secondary | ICD-10-CM | POA: Diagnosis not present

## 2023-06-16 DIAGNOSIS — Z13220 Encounter for screening for lipoid disorders: Secondary | ICD-10-CM | POA: Diagnosis not present

## 2023-06-16 LAB — CBC WITH DIFFERENTIAL/PLATELET
Basophils Absolute: 0.1 10*3/uL (ref 0.0–0.1)
Basophils Relative: 1.2 % (ref 0.0–3.0)
Eosinophils Absolute: 0.3 10*3/uL (ref 0.0–0.7)
Eosinophils Relative: 6.7 % — ABNORMAL HIGH (ref 0.0–5.0)
HCT: 43.6 % (ref 39.0–52.0)
Hemoglobin: 15.4 g/dL (ref 13.0–17.0)
Lymphocytes Relative: 19.9 % (ref 12.0–46.0)
Lymphs Abs: 1 10*3/uL (ref 0.7–4.0)
MCHC: 35.3 g/dL (ref 30.0–36.0)
MCV: 86.3 fl (ref 78.0–100.0)
Monocytes Absolute: 0.5 10*3/uL (ref 0.1–1.0)
Monocytes Relative: 9.2 % (ref 3.0–12.0)
Neutro Abs: 3.2 10*3/uL (ref 1.4–7.7)
Neutrophils Relative %: 63 % (ref 43.0–77.0)
Platelets: 165 10*3/uL (ref 150.0–400.0)
RBC: 5.05 Mil/uL (ref 4.22–5.81)
RDW: 12.4 % (ref 11.5–15.5)
WBC: 5 10*3/uL (ref 4.0–10.5)

## 2023-06-16 LAB — COMPREHENSIVE METABOLIC PANEL
ALT: 15 U/L (ref 0–53)
AST: 12 U/L (ref 0–37)
Albumin: 4.7 g/dL (ref 3.5–5.2)
Alkaline Phosphatase: 58 U/L (ref 39–117)
BUN: 13 mg/dL (ref 6–23)
CO2: 32 mEq/L (ref 19–32)
Calcium: 10.1 mg/dL (ref 8.4–10.5)
Chloride: 102 mEq/L (ref 96–112)
Creatinine, Ser: 0.73 mg/dL (ref 0.40–1.50)
GFR: 118.89 mL/min (ref >60.00)
Glucose, Bld: 82 mg/dL (ref 70–99)
Potassium: 4.4 mEq/L (ref 3.5–5.1)
Sodium: 140 mEq/L (ref 135–145)
Total Bilirubin: 0.8 mg/dL (ref 0.2–1.2)
Total Protein: 6.7 g/dL (ref 6.0–8.3)

## 2023-06-16 LAB — TSH: TSH: 0.54 u[IU]/mL (ref 0.35–5.50)

## 2023-06-16 LAB — LIPID PANEL
Cholesterol: 117 mg/dL (ref 0–200)
HDL: 42.6 mg/dL (ref >39.00)
LDL Cholesterol: 58 mg/dL (ref 0–99)
NonHDL: 73.99
Total CHOL/HDL Ratio: 3
Triglycerides: 80 mg/dL (ref 0.0–149.0)
VLDL: 16 mg/dL (ref 0.0–40.0)

## 2023-06-16 LAB — VITAMIN D 25 HYDROXY (VIT D DEFICIENCY, FRACTURES): VITD: 30.11 ng/mL (ref 30.00–100.00)

## 2023-06-16 LAB — VITAMIN B12: Vitamin B-12: 336 pg/mL (ref 211–911)

## 2023-06-16 NOTE — Progress Notes (Signed)
Phone: [phone]  Subjective:  Patient 32 y.o. male presenting for annual physical.  Chief Complaint  Patient presents with   Annual Exam    Non fasting w/ labs    See problem oriented charting- ROS- full  review of systems was completed and negative.   The following were reviewed and entered/updated in epic: Past Medical History:  Diagnosis Date   Anxiety    Asthma    Depression    Patient Active Problem List   Diagnosis Date Noted   Allergic rhinitis due to allergen 12/18/2022   Past Surgical History:  Procedure Laterality Date   WISDOM TOOTH EXTRACTION      Family History  Problem Relation Age of Onset   Diabetes Mother    Cancer Mother    Hypertension Father    Diabetes Father    Depression Father     Medications- reviewed and updated Current Outpatient Medications  Medication Sig Dispense Refill   triamcinolone (NASACORT) 55 MCG/ACT AERO nasal inhaler Place 1 spray into the nose daily. START with 1 spray each side bid for 3 days, then reduce to qd 16.9 mL 1   No current facility-administered medications for this visit.    Allergies-reviewed and updated No Known Allergies  Social History   Social History Narrative   Not on file    Objective:  BP 112/74   Pulse 76   Temp (!) 97.1 F (36.2 C) (Temporal)   Ht 5\' 9"  (1.753 m)   Wt 159 lb 3.2 oz (72.2 kg)   SpO2 96%   BMI 23.51 kg/m  Physical Exam Vitals and nursing note reviewed.  Constitutional:      General: He is not in acute distress.    Appearance: Normal appearance.  HENT:     Head: Normocephalic.     Right Ear: Tympanic membrane and external ear normal.     Left Ear: Tympanic membrane and external ear normal.     Nose: Nose normal.     Mouth/Throat:     Mouth: Mucous membranes are moist.  Eyes:     Extraocular Movements: Extraocular movements intact.  Cardiovascular:     Rate and Rhythm: Normal rate and regular rhythm.  Pulmonary:     Effort: Pulmonary effort is normal.      Breath sounds: Normal breath sounds.  Abdominal:     General: Abdomen is flat. There is no distension.     Palpations: Abdomen is soft.     Tenderness: There is no abdominal tenderness.  Musculoskeletal:        General: Normal range of motion.     Cervical back: Normal range of motion.  Skin:    General: Skin is warm and dry.  Neurological:     Mental Status: He is alert and oriented to person, place, and time.  Psychiatric:        Mood and Affect: Mood normal.        Behavior: Behavior normal.        Judgment: Judgment normal.      Assessment and Plan   Health Maintenance counseling: 1. Anticipatory guidance: Patient counseled regarding regular dental exams q6 months, eye exams yearly, avoiding smoking and second hand smoke, limiting alcohol to 2 beverages per day.   2. Risk factor reduction:  Advised patient of need for regular exercise and diet rich in fruits and vegetables to reduce risk of heart attack and stroke.    Wt Readings from Last 3 Encounters:  06/16/23 159 lb 3.2  oz (72.2 kg)  05/26/23 161 lb 12.8 oz (73.4 kg)  12/16/22 158 lb 8 oz (71.9 kg)   3. Immunizations/screenings/ancillary studies Immunization History  Administered Date(s) Administered   DTaP 05/26/1989, 08/04/1989, 10/25/1989, 09/25/1990   Hepatitis B, PED/ADOLESCENT 07/19/2001, 09/20/2001, 02/17/2002   MMR 09/25/1990, 12/22/1995   PFIZER(Purple Top)SARS-COV-2 Vaccination 04/25/2020, 10/09/2020   Tdap 12/27/2012   Health Maintenance Due  Topic Date Due   HIV Screening  Never done   Hepatitis C Screening  Never done   DTaP/Tdap/Td (6 - Td or Tdap) 12/27/2022    4. Skin cancer screening-  advised regular sunscreen use. Denies worrisome, changing, or new skin lesions.  5. Smoking associated screening: non- smoker  6. STD screening - N/A 7. Alcohol screening: 2-3 drinks   Annual physical exam -     CBC with Differential/Platelet -     Comprehensive metabolic panel -     Lipid panel -      TSH  Vegetarian diet -     Vitamin B12 -     VITAMIN D 25 Hydroxy (Vit-D Deficiency, Fractures)  Screening for HIV (human immunodeficiency virus) -     HIV Antibody (routine testing w rflx)  Need for hepatitis C screening test -     Hepatitis C antibody  Recommended follow up: No follow-ups on file. No future appointments.  Lab/Order associations: non- fasting   Dulce Sellar, NP

## 2023-06-16 NOTE — Patient Instructions (Addendum)
It was very nice to see you today!   I will review your lab results via MyChart in a few days.   Have a great weekend!    PLEASE NOTE:  If you had any lab tests please let us know if you have not heard back within a few days. You may see your results on MyChart before we have a chance to review them but we will give you a call once they are reviewed by us. If we ordered any referrals today, please let us know if you have not heard from their office within the next week.    

## 2023-06-17 LAB — HIV ANTIBODY (ROUTINE TESTING W REFLEX): HIV 1&2 Ab, 4th Generation: NONREACTIVE

## 2023-06-17 LAB — HEPATITIS C ANTIBODY: Hepatitis C Ab: NONREACTIVE
